# Patient Record
Sex: Male | Born: 1975
Health system: Southern US, Community
[De-identification: ages and names within clinical notes are randomized; demographics above are authoritative.]

## PROBLEM LIST (undated history)

## (undated) DIAGNOSIS — N200 Calculus of kidney: Secondary | ICD-10-CM

## (undated) DIAGNOSIS — K219 Gastro-esophageal reflux disease without esophagitis: Secondary | ICD-10-CM

## (undated) DIAGNOSIS — K921 Melena: Secondary | ICD-10-CM

## (undated) HISTORY — DX: Calculus of kidney: N20.0

## (undated) HISTORY — DX: Melena: K92.1

## (undated) HISTORY — DX: Gastro-esophageal reflux disease without esophagitis: K21.9

---

## 2004-11-10 ENCOUNTER — Encounter: Admission: RE | Admit: 2004-11-10 | Discharge: 2004-11-10 | Payer: Self-pay | Admitting: Specialist

## 2005-03-24 ENCOUNTER — Emergency Department (HOSPITAL_COMMUNITY): Admission: EM | Admit: 2005-03-24 | Discharge: 2005-03-24 | Payer: Self-pay | Admitting: Family Medicine

## 2005-09-14 ENCOUNTER — Emergency Department (HOSPITAL_COMMUNITY): Admission: EM | Admit: 2005-09-14 | Discharge: 2005-09-14 | Payer: Self-pay | Admitting: Family Medicine

## 2007-09-13 ENCOUNTER — Emergency Department (HOSPITAL_COMMUNITY): Admission: EM | Admit: 2007-09-13 | Discharge: 2007-09-13 | Payer: Self-pay | Admitting: Emergency Medicine

## 2008-09-12 ENCOUNTER — Emergency Department (HOSPITAL_COMMUNITY): Admission: EM | Admit: 2008-09-12 | Discharge: 2008-09-12 | Payer: Self-pay | Admitting: Emergency Medicine

## 2008-10-11 ENCOUNTER — Ambulatory Visit (HOSPITAL_COMMUNITY): Admission: RE | Admit: 2008-10-11 | Discharge: 2008-10-11 | Payer: Self-pay | Admitting: Gastroenterology

## 2009-10-18 ENCOUNTER — Emergency Department (HOSPITAL_COMMUNITY): Admission: EM | Admit: 2009-10-18 | Discharge: 2009-10-18 | Payer: Self-pay | Admitting: Emergency Medicine

## 2010-05-15 ENCOUNTER — Ambulatory Visit (INDEPENDENT_AMBULATORY_CARE_PROVIDER_SITE_OTHER): Payer: 59

## 2010-05-15 ENCOUNTER — Inpatient Hospital Stay (INDEPENDENT_AMBULATORY_CARE_PROVIDER_SITE_OTHER)
Admission: RE | Admit: 2010-05-15 | Discharge: 2010-05-15 | Disposition: A | Discharge status: Home / Self Care | Payer: 59 | Source: Ambulatory Visit | Attending: Family Medicine | Admitting: Family Medicine

## 2010-05-15 DIAGNOSIS — R109 Unspecified abdominal pain: Secondary | ICD-10-CM

## 2010-05-15 LAB — CBC
HCT: 44.1 % (ref 39.0–52.0)
Hemoglobin: 15.1 g/dL (ref 13.0–17.0)
WBC: 4.8 10*3/uL (ref 4.0–10.5)

## 2010-05-15 LAB — POCT I-STAT, CHEM 8
BUN: 8 mg/dL (ref 6–23)
Chloride: 100 mEq/L (ref 96–112)
Creatinine, Ser: 1.1 mg/dL (ref 0.4–1.5)
Glucose, Bld: 84 mg/dL (ref 70–99)
Potassium: 3.9 mEq/L (ref 3.5–5.1)

## 2010-12-31 ENCOUNTER — Emergency Department (HOSPITAL_COMMUNITY)
Admission: EM | Admit: 2010-12-31 | Discharge: 2011-01-01 | Disposition: A | Discharge status: Home / Self Care | Payer: 59 | Attending: Emergency Medicine | Admitting: Emergency Medicine

## 2010-12-31 DIAGNOSIS — IMO0002 Reserved for concepts with insufficient information to code with codable children: Secondary | ICD-10-CM | POA: Insufficient documentation

## 2010-12-31 DIAGNOSIS — R109 Unspecified abdominal pain: Secondary | ICD-10-CM | POA: Insufficient documentation

## 2010-12-31 DIAGNOSIS — R10819 Abdominal tenderness, unspecified site: Secondary | ICD-10-CM | POA: Insufficient documentation

## 2010-12-31 DIAGNOSIS — N201 Calculus of ureter: Secondary | ICD-10-CM | POA: Insufficient documentation

## 2011-01-01 ENCOUNTER — Emergency Department (HOSPITAL_COMMUNITY): Payer: 59

## 2011-01-01 LAB — URINALYSIS, ROUTINE W REFLEX MICROSCOPIC
Bilirubin Urine: NEGATIVE
Ketones, ur: NEGATIVE mg/dL
Protein, ur: NEGATIVE mg/dL
Urobilinogen, UA: 0.2 mg/dL (ref 0.0–1.0)

## 2011-01-01 LAB — CBC
HCT: 40.9 % (ref 39.0–52.0)
MCH: 30 pg (ref 26.0–34.0)
MCV: 85.9 fL (ref 78.0–100.0)
Platelets: 209 10*3/uL (ref 150–400)
RBC: 4.76 MIL/uL (ref 4.22–5.81)

## 2011-01-01 LAB — HEPATIC FUNCTION PANEL
ALT: 17 U/L (ref 0–53)
Bilirubin, Direct: <0.1 mg/dL (ref 0.0–0.3)
Total Bilirubin: 0.2 mg/dL — ABNORMAL LOW (ref 0.3–1.2)

## 2011-01-01 LAB — URINE MICROSCOPIC-ADD ON

## 2011-01-01 LAB — DIFFERENTIAL
Eosinophils Absolute: 0.3 10*3/uL (ref 0.0–0.7)
Eosinophils Relative: 5 % (ref 0–5)
Lymphs Abs: 3.6 10*3/uL (ref 0.7–4.0)
Monocytes Relative: 4 % (ref 3–12)
Neutrophils Relative %: 33 % — ABNORMAL LOW (ref 43–77)

## 2011-01-01 LAB — POCT I-STAT, CHEM 8
BUN: 13 mg/dL (ref 6–23)
Calcium, Ion: 1.19 mmol/L (ref 1.12–1.32)
Chloride: 103 mEq/L (ref 96–112)
Glucose, Bld: 109 mg/dL — ABNORMAL HIGH (ref 70–99)

## 2011-01-01 MED ORDER — IOHEXOL 300 MG/ML  SOLN
90.0000 mL | Freq: Once | INTRAMUSCULAR | Status: AC | PRN
  Administered 2011-01-01: 90 mL via INTRAVENOUS

## 2013-02-25 IMAGING — CT CT ABD-PELV W/ CM
2 of 4 series · 17 of 46 positions shown, 19 images · IV contrast (APPLIED)
Comparison: None.

CLINICAL DATA: Acute abdominal pain

CT ABDOMEN AND PELVIS WITH CONTRAST
TECHNIQUE: Multidetector CT imaging of the abdomen and pelvis was
performed following the standard protocol during bolus
administration of intravenous contrast.
Contrast: 90mL OMNIPAQUE IOHEXOL 300 MG/ML IV SOLN

[Series 2: abd/pelv with 5.0 b31f st · axial · 0.76mm/px · z∈[-429,+1]mm · 14 of 94 slices shown, 16 images]
[im 4/94  soft-tissue]
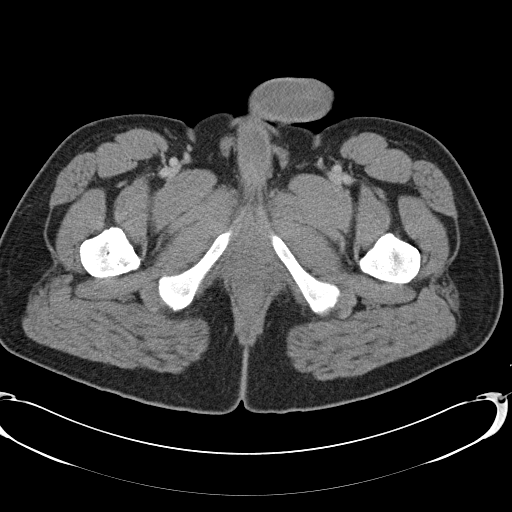
[im 4/94  bone]
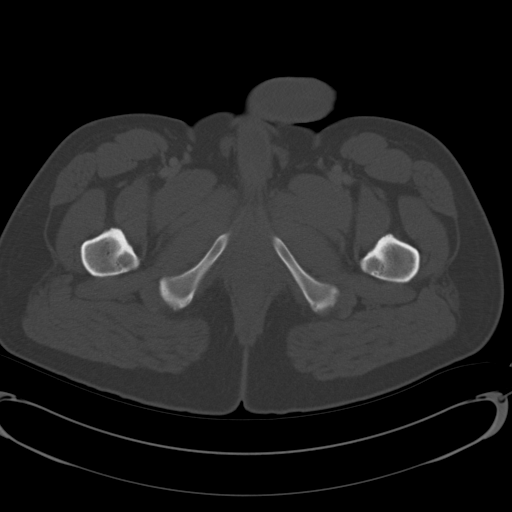
[im 12/94  soft-tissue]
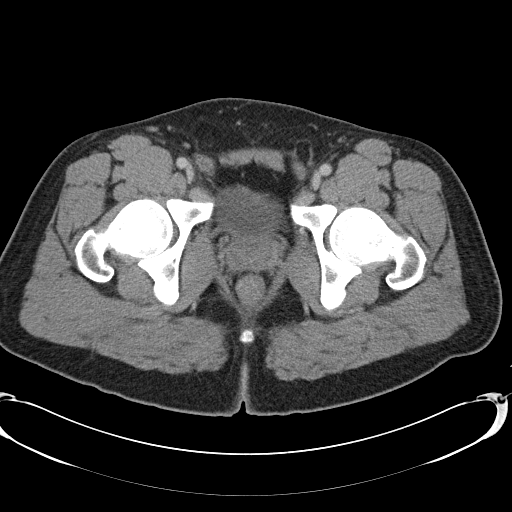
[im 20/94  soft-tissue]
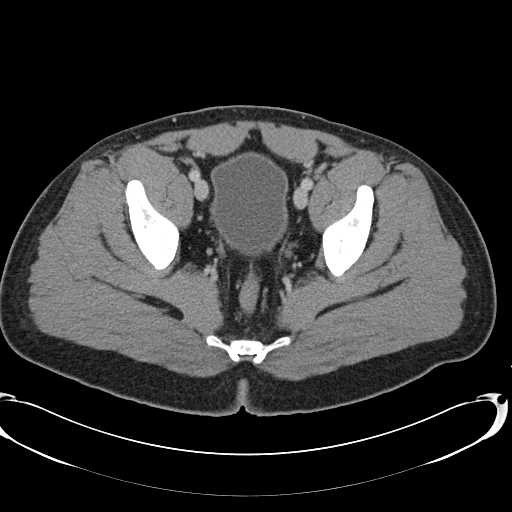
[im 24/94  soft-tissue]
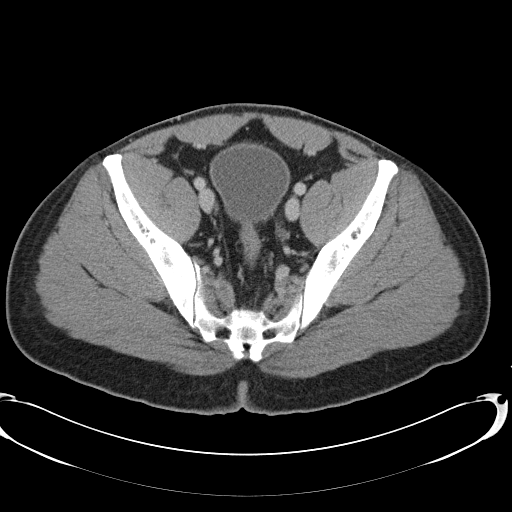
[im 32/94  soft-tissue]
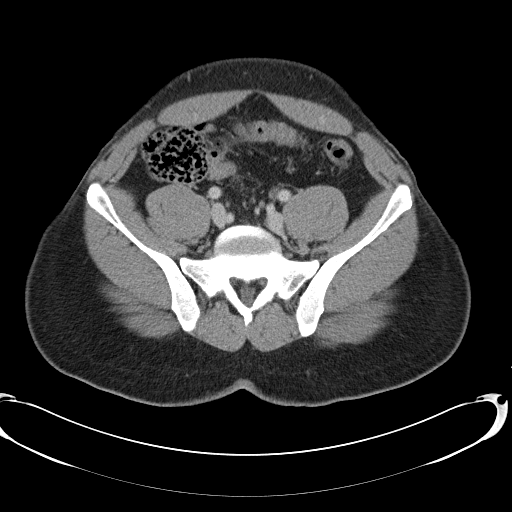
[im 39/94  soft-tissue]
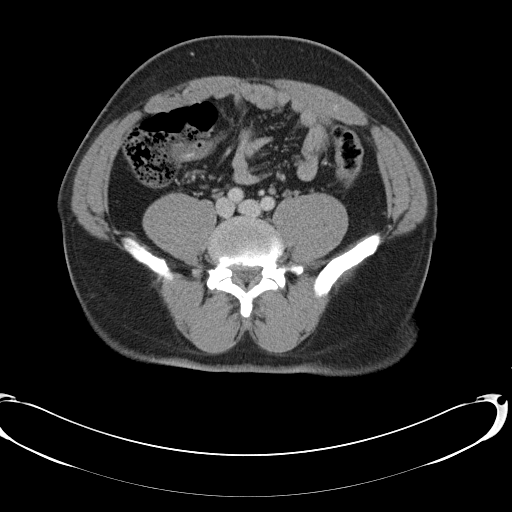
[im 43/94  soft-tissue]
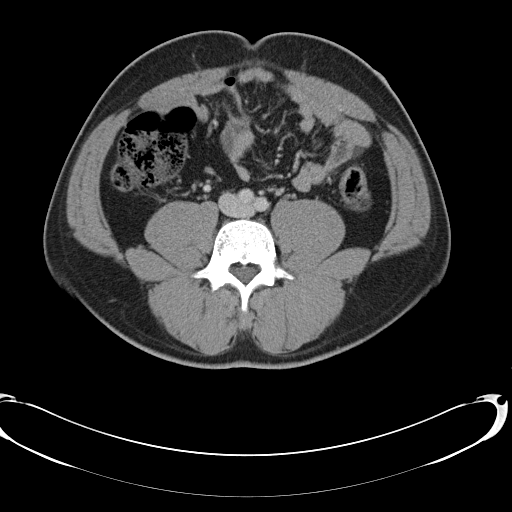
[im 51/94  soft-tissue]
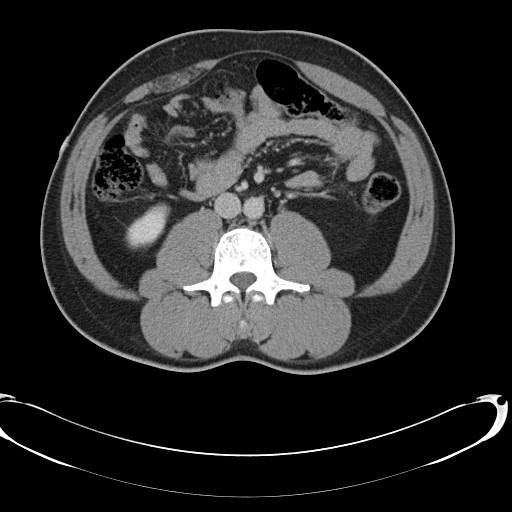
[im 55/94  soft-tissue]
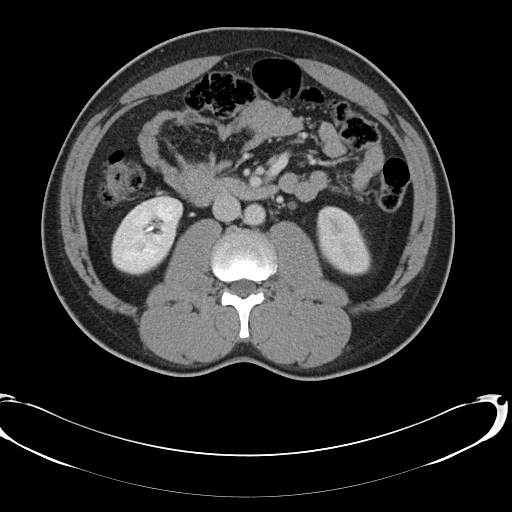
[im 55/94  bone]
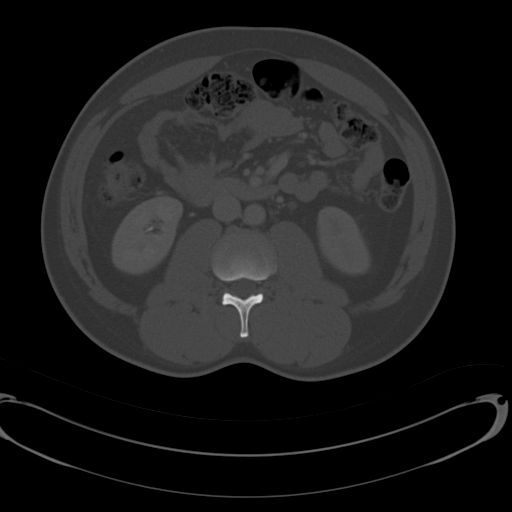
[im 63/94  soft-tissue]
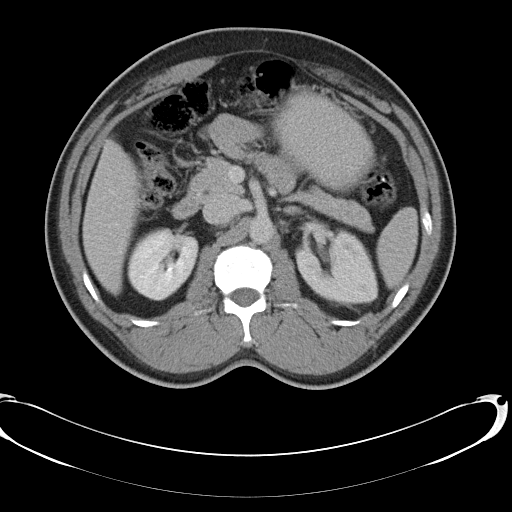
[im 70/94  soft-tissue]
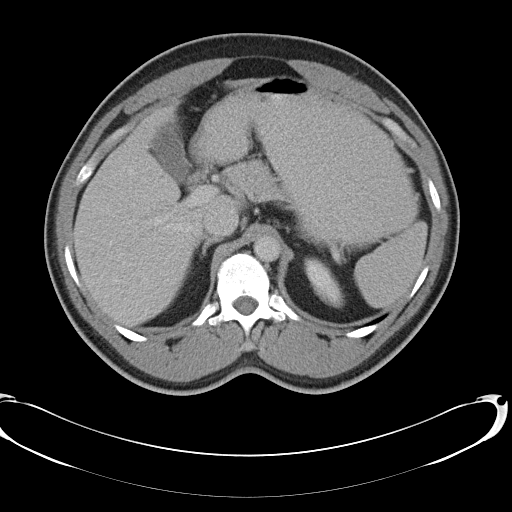
[im 74/94  soft-tissue]
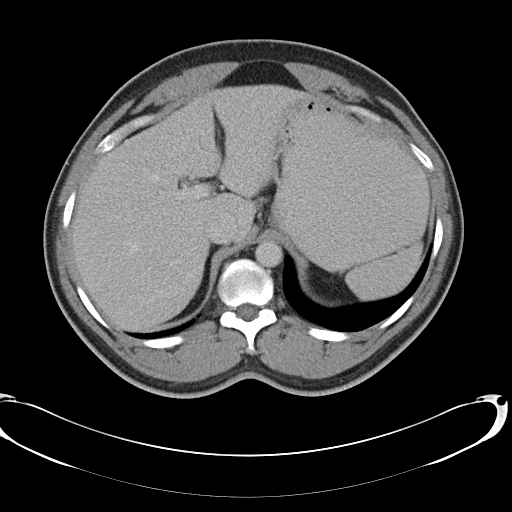
[im 82/94  soft-tissue]
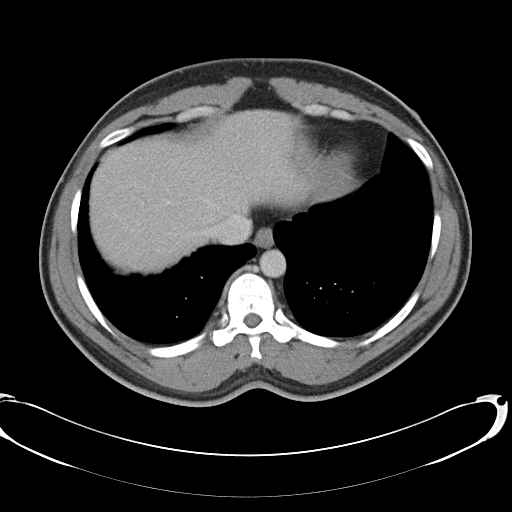
[im 90/94  soft-tissue]
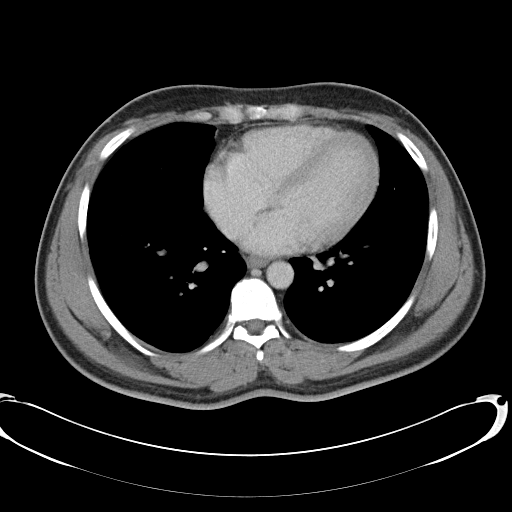

[Series 5: abd/pelv with 2.0 spo cor st · coronal · 0.91mm/px · 3 of 123 slices shown]
[im 41/123  soft-tissue]
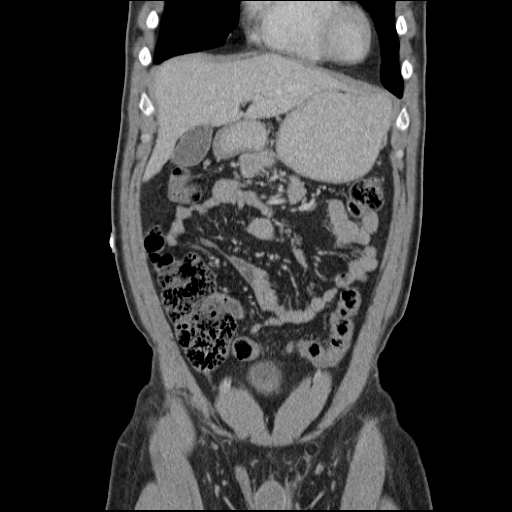
[im 55/123  soft-tissue]
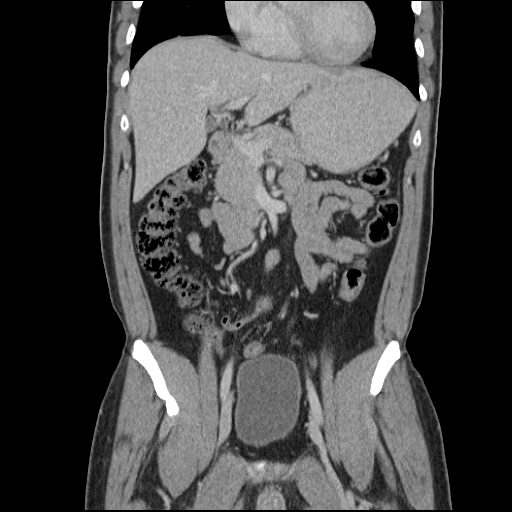
[im 68/123  soft-tissue]
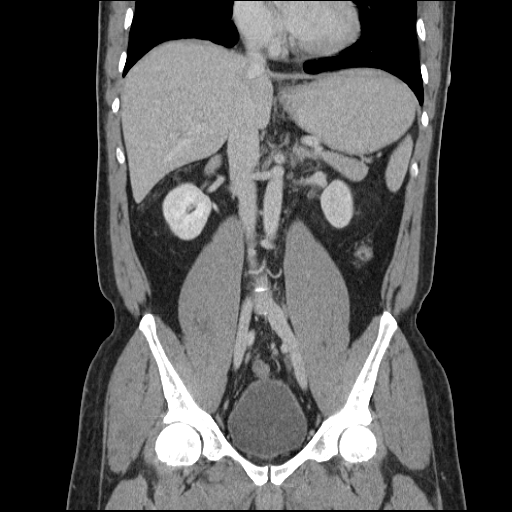

[17 of 46 positions shown; findings below may reference images not displayed]

FINDINGS: The lung bases are clear.  The liver, spleen,
gallbladder, bile ducts, pancreas, adrenal glands, stomach, small
bowel, abdominal aorta, and retroperitoneal lymph nodes are
unremarkable.  Kidneys appear symmetrical in size.  There is a
stone in the left ureter vesicle junction measuring about 4 mm
diameter with proximal left ureterectasis and pyelocaliectasis
associated with slightly delayed appearance of contrast material in
the renal collecting system.  There is mild periureteral stranding
consistent with moderate obstruction.  The right renal collecting
system and ureter are decompressed.  No free air or free fluid in
the abdomen.

Pelvis:  No free or loculated pelvic fluid collections.  The
bladder wall is not thickened.  No inflammatory changes suggested
in the colon.  The appendix is normal.  Normal alignment of the
lumbar spine.
IMPRESSION: 4 mm moderately obstructing stone in the left ureterovesicle
junction.

## 2013-09-13 ENCOUNTER — Ambulatory Visit (INDEPENDENT_AMBULATORY_CARE_PROVIDER_SITE_OTHER): Payer: 59 | Admitting: Family Medicine

## 2013-09-13 ENCOUNTER — Encounter: Payer: Self-pay | Admitting: Family Medicine

## 2013-09-13 VITALS — BP 128/82 | HR 70 | Temp 98.1°F | Ht 66.0 in | Wt 191.5 lb

## 2013-09-13 DIAGNOSIS — Z0000 Encounter for general adult medical examination without abnormal findings: Secondary | ICD-10-CM

## 2013-09-13 DIAGNOSIS — Z7689 Persons encountering health services in other specified circumstances: Secondary | ICD-10-CM

## 2013-09-13 DIAGNOSIS — J309 Allergic rhinitis, unspecified: Secondary | ICD-10-CM

## 2013-09-13 DIAGNOSIS — Z7189 Other specified counseling: Secondary | ICD-10-CM

## 2013-09-13 DIAGNOSIS — K649 Unspecified hemorrhoids: Secondary | ICD-10-CM | POA: Insufficient documentation

## 2013-09-13 DIAGNOSIS — K219 Gastro-esophageal reflux disease without esophagitis: Secondary | ICD-10-CM | POA: Insufficient documentation

## 2013-09-13 LAB — LIPID PANEL
Cholesterol: 258 mg/dL — ABNORMAL HIGH (ref 0–200)
HDL: 78.2 mg/dL (ref >39.00)
LDL Cholesterol: 158 mg/dL — ABNORMAL HIGH (ref 0–99)
NONHDL: 179.8
Total CHOL/HDL Ratio: 3
Triglycerides: 108 mg/dL (ref 0.0–149.0)
VLDL: 21.6 mg/dL (ref 0.0–40.0)

## 2013-09-13 LAB — HEMOGLOBIN A1C: HEMOGLOBIN A1C: 5.4 % (ref 4.6–6.5)

## 2013-09-13 MED ORDER — FLUTICASONE PROPIONATE 50 MCG/ACT NA SUSP: 2.0000 | Freq: Every day | NASAL | Status: AC

## 2013-09-13 NOTE — Progress Notes (Signed)
Pre visit review using our clinic review tool, if applicable. No additional management support is needed unless otherwise documented below in the visit note. 

## 2013-09-13 NOTE — Progress Notes (Signed)
No chief complaint on file.   HPI:  Kenneth Floyd is here to establish care. Needs physical form completed for physical test. Currently very active - running 3 miles and working out without any CP, SOB, wheezing, plapitations, syncope. Denies FH of sudden death. No personal hx of heart or lung disease. Will need to run 1 mile for physical test and reports does much more then this on a regular basis without any symptoms or trouble.  Last PCP and physical: has been several years  Has the following chronic problems and concerns today:  Allergies/Cough: -flare started last night -not taking anything for allergies now -symptoms: sneezing, cough, drainage, PND -denies: SOB, fever, NVD, sinus pain, tooth pain  GERD/occ BRBPR: -rare, takes zantac rarely, < a few times per month -denies: heartburn, weight loss, abd pain -evaluated by Dr. Audley HoseHong in the past with colonoscopy in 2010 and has hemorrhoids - treated with topical therapy from time to time   Patient Active Problem List   Diagnosis Date Noted  . Hemorrhoids 09/13/2013  . GERD (gastroesophageal reflux disease) 09/13/2013  . Allergic rhinitis 09/13/2013    Health Maintenance: -reviewed - doing fasting labs today for physical  Review of Systems  Constitutional: Negative for fever, chills and malaise/fatigue.  HENT: Negative for ear pain and hearing loss.   Eyes: Negative for blurred vision and double vision.  Respiratory: Positive for cough. Negative for hemoptysis, shortness of breath and wheezing.   Cardiovascular: Negative for chest pain, palpitations and leg swelling.  Gastrointestinal: Negative for heartburn, abdominal pain, diarrhea and constipation.  Genitourinary: Negative for dysuria and urgency.  Musculoskeletal: Negative for falls and myalgias.  Skin: Negative for rash.  Neurological: Negative for dizziness and weakness.  Endo/Heme/Allergies: Does not bruise/bleed easily.  Psychiatric/Behavioral: Negative for  depression.     Past Medical History  Diagnosis Date  . Blood in stool   . GERD (gastroesophageal reflux disease)   . Kidney stones     Family History  Problem Relation Age of Onset  . Heart disease Mother   . Hypertension Father   . Stroke Father     History   Social History  . Marital Status: Married    Spouse Name: N/A    Number of Children: N/A  . Years of Education: N/A   Social History Main Topics  . Smoking status: Never Smoker   . Smokeless tobacco: None  . Alcohol Use: Yes     Comment: occ few drinks  . Drug Use: No  . Sexual Activity: None   Other Topics Concern  . None   Social History Narrative   Work or School: Designer, industrial/productlab tech at [employer]      Home Situation: lives with wife and 3 kids (38 yo daughter, 38 yo daughter (coming here as well), son is 666 yo in 2015      Spiritual Beliefs: Christian      Lifestyle: regular exercise; diet is ok             Current outpatient prescriptions:fluticasone (FLONASE) 50 MCG/ACT nasal spray, Place 2 sprays into both nostrils daily., Disp: 16 g, Rfl: 1  EXAM:  Filed Vitals:   09/13/13 0955  BP: 128/82  Pulse: 70  Temp: 98.1 F (36.7 C)    Body mass index is 30.92 kg/(m^2).  GENERAL: vitals reviewed and listed above, alert, oriented, appears well hydrated and in no acute distress  HEENT: atraumatic, conjunttiva clear, no obvious abnormalities on inspection of external nose and ears  NECK: no obvious masses on inspection  LUNGS: clear to auscultation bilaterally, no wheezes, rales or rhonchi, good air movement  CV: HRRR, no peripheral edema  MS: moves all extremities without noticeable abnormality  PSYCH: pleasant and cooperative, no obvious depression or anxiety  ASSESSMENT AND PLAN:  Discussed the following assessment and plan:  Visit for preventive health examination - Plan: RPR, HIV antibody (with reflex), Hep B Surface Antigen, Hepatitis B Core AB, Total  Hemorrhoids  GERD (gastroesophageal  reflux disease)  Allergic rhinitis - Plan: fluticasone (FLONASE) 50 MCG/ACT nasal spray  Encounter to establish care - Plan: Lipid Panel, Hemoglobin A1c   -We reviewed the PMH, PSH, FH, SH, Meds and Allergies. -We provided refills for any medications we will prescribe as needed. -We addressed current concerns per orders and patient instructions. -We have asked for records for pertinent exams, studies, vaccines and notes from previous providers. -We have advised patient to follow up per instructions below. -FASTING labs per orders -level a and b uspstf recs discussed -complete form after basic labs -rx flonase and claritin for allergies -follow up as needed nad yearly  -Patient advised to return or notify a doctor immediately if symptoms worsen or persist or new concerns arise.  Patient Instructions  -start flonase and claritin daily for 1 month for allergies/cough - follow up if persists or worsens  -We have ordered labs or studies at this visit. It can take up to 1-2 weeks for results and processing. We will contact you with instructions IF your results are abnormal. Normal results will be released to your [facility]. If you have not heard from us or can not find your results in [facility] in 2 weeks please contact our office.  -PLEASE SIGN UP FOR MYCHART TODAY   We recommend the following healthy lifestyle measures: - eat a healthy diet consisting of lots of vegetables, fruits, beans, nuts, seeds, healthy meats such as white chicken and fish and whole grains.  - avoid fried foods, fast food, processed foods, sodas, red meet and other fattening foods.  - get a least 150 minutes of aerobic exercise per week.   Follow up in: as needed and in 1 year      KIM, Dahlia ClientHANNAH R.

## 2013-09-13 NOTE — Patient Instructions (Signed)
-  start flonase and claritin daily for 1 month for allergies/cough - follow up if persists or worsens  -We have ordered labs or studies at this visit. It can take up to 1-2 weeks for results and processing. We will contact you with instructions IF your results are abnormal. Normal results will be released to your [facility]. If you have not heard from us or can not find your results in [facility] in 2 weeks please contact our office.  -PLEASE SIGN UP FOR MYCHART TODAY   We recommend the following healthy lifestyle measures: - eat a healthy diet consisting of lots of vegetables, fruits, beans, nuts, seeds, healthy meats such as white chicken and fish and whole grains.  - avoid fried foods, fast food, processed foods, sodas, red meet and other fattening foods.  - get a least 150 minutes of aerobic exercise per week.   Follow up in: as needed and in 1 year

## 2013-09-14 LAB — HEPATITIS B CORE ANTIBODY, TOTAL: Hep B Core Total Ab: REACTIVE — AB

## 2013-09-14 LAB — HIV ANTIBODY (ROUTINE TESTING W REFLEX): HIV: NONREACTIVE

## 2013-09-14 LAB — HEPATITIS B SURFACE ANTIGEN: HEP B S AG: NEGATIVE

## 2013-09-14 LAB — RPR

## 2013-09-15 NOTE — Addendum Note (Signed)
Addended by: Terressa KoyanagiKIM, HANNAH R on: 09/15/2013 04:45 PM   Modules accepted: Orders

## 2013-09-18 ENCOUNTER — Telehealth: Payer: Self-pay | Admitting: Family Medicine

## 2013-09-18 NOTE — Telephone Encounter (Signed)
Dr Selena BattenKim completed the form and the original was given to the pts wife while she was here at an appt with her daughter.

## 2013-09-18 NOTE — Telephone Encounter (Signed)
Pt called to see if his paperwork for his job is ready for pick-up?  He states he dropped it off on 09/13/13.  I advised him it takes 5-7 business days and he will receive a callback once it is ready.  Pt verbalized understanding.

## 2013-10-24 ENCOUNTER — Other Ambulatory Visit (INDEPENDENT_AMBULATORY_CARE_PROVIDER_SITE_OTHER): Payer: 59

## 2013-10-24 DIAGNOSIS — Z7689 Persons encountering health services in other specified circumstances: Secondary | ICD-10-CM

## 2013-10-24 DIAGNOSIS — Z7189 Other specified counseling: Secondary | ICD-10-CM

## 2013-10-25 LAB — HEPATITIS B SURFACE ANTIBODY,QUALITATIVE: Hep B S Ab: POSITIVE — AB

## 2013-10-26 ENCOUNTER — Telehealth: Payer: Self-pay | Admitting: *Deleted

## 2013-10-26 NOTE — Telephone Encounter (Signed)
Patient called in regards to the results from the Hep B test.  I advised him he does not have Hepatitis C and the test shows he had Hepatitis B before in the past but this is resolved and he has immunity to it, no medications are needed and he agreed.

## 2013-11-22 ENCOUNTER — Telehealth: Payer: Self-pay | Admitting: Family Medicine

## 2013-11-22 ENCOUNTER — Ambulatory Visit (INDEPENDENT_AMBULATORY_CARE_PROVIDER_SITE_OTHER): Payer: 59 | Admitting: Physician Assistant

## 2013-11-22 ENCOUNTER — Encounter: Payer: Self-pay | Admitting: Physician Assistant

## 2013-11-22 VITALS — BP 104/70 | HR 72 | Temp 98.0°F | Resp 18 | Wt 192.7 lb

## 2013-11-22 DIAGNOSIS — B9789 Other viral agents as the cause of diseases classified elsewhere: Secondary | ICD-10-CM

## 2013-11-22 DIAGNOSIS — B349 Viral infection, unspecified: Secondary | ICD-10-CM

## 2013-11-22 NOTE — Telephone Encounter (Signed)
Patient Information:  Caller Name: [employer]  Phone: [phone]  Patient: Kenneth Floyd, Kenneth Floyd  Gender: Male  DOB: December 05, 1975  Age: 38 Years  PCP: Selena Batten (TEXT 1st, after 20 mins can call), Dahlia Client [facility])  Office Follow Up:  Does the office need to follow up with this patient?: No  Instructions For The Office: N/A   Symptoms  Reason For Call & Symptoms: Pt is calling to report all his muscles hurt. His legs/chest/back and arms. Pt feels like he has been beat up. Onset 2 days ago. No cold sx. Pt aler/no signs of a stiff neck. No joint swelling noted.   Reviewed Health History In EMR: Yes  Reviewed Medications In EMR: Yes  Reviewed Allergies In EMR: Yes  Reviewed Surgeries / Procedures: Yes  Date of Onset of Symptoms: 11/20/2013  Guideline(s) Used:  No Protocol Available - Sick Adult  Disposition Per Guideline:   See Today or Tomorrow in Office  Reason For Disposition Reached:   Nursing judgment  Advice Given:  N/A  Patient Will Follow Care Advice:  YES  Appointment Scheduled:  11/22/2013 15:15:00 Appointment Scheduled Provider:  Donell Beers (only sees ages 18 and up)

## 2013-11-22 NOTE — Progress Notes (Signed)
Pre visit review using our clinic review tool, if applicable. No additional management support is needed unless otherwise documented below in the visit note. 

## 2013-11-22 NOTE — Progress Notes (Signed)
Subjective:    Patient ID: Kenneth Floyd, male    DOB: [birth year], 38 y.o.   MRN: [id number]  HPI Patient is a 38 y.o. male presenting for body aches. Pt states that he started noticing all of his muscles aching 3 days ago. He feels like he "got beat up." Pt states he has felt chills, but has not recorded any fever. He also states that he feels fatigued, and yesterday he began to cough a little. The cough is dry. He also states that his saliva has begun to taste bitter, and he has been sneezing some, and has a mild headache. He also admits to a little neck soreness along with his generalized muscle aches, however no neck stiffness. No sinus pressure or pain, no post nasal drip, and has not had any ear symptoms. Patient denies abdominal pain, nausea, vomiting, diarrhea, shortness of breath, chest pain, syncope. He has taking OTC ibuprofen for the pain, but states this has not helped.      Review of Systems As per HPI and are otherwise negative.    Past Medical History  Diagnosis Date  . Blood in stool   . GERD (gastroesophageal reflux disease)   . Kidney stones     History   Social History  . Marital Status: Married    Spouse Name: N/A    Number of Children: N/A  . Years of Education: N/A   Occupational History  . Not on file.   Social History Main Topics  . Smoking status: Never Smoker   . Smokeless tobacco: Not on file  . Alcohol Use: Yes     Comment: occ few drinks  . Drug Use: No  . Sexual Activity: Not on file   Other Topics Concern  . Not on file   Social History Narrative   Work or School: Designer, industrial/product at [employer] Situation: lives with wife and 3 kids (46 yo daughter, 20 yo daughter (coming here as well), son is 33 yo in 2015      Spiritual Beliefs: Christian      Lifestyle: regular exercise; diet is ok             History reviewed. No pertinent past surgical history.  Family History  Problem Relation Age of Onset  . Heart disease Mother     . Hypertension Father   . Stroke Father     No Known Allergies  Current Outpatient Prescriptions on File Prior to Visit  Medication Sig Dispense Refill  . fluticasone (FLONASE) 50 MCG/ACT nasal spray Place 2 sprays into both nostrils daily.  16 g  1   No current facility-administered medications on file prior to visit.    EXAM: BP 104/70  Pulse 72  Temp(Src) 98 F (36.7 C) (Oral)  Resp 18  Wt 192 lb 11.2 oz (87.408 kg)      Objective:   Physical Exam  Nursing note and vitals reviewed. Constitutional: He is oriented to person, place, and time. He appears well-developed and well-nourished. No distress.  HENT:  Head: Normocephalic and atraumatic.  Right Ear: External ear normal.  Left Ear: External ear normal.  Nose: Nose normal.  Mouth/Throat: Oropharynx is clear and moist. No oropharyngeal exudate.  Bilateral TMs normal. Bilateral frontal and maxillary sinuses non-TTP.  Eyes: Conjunctivae and EOM are normal. Pupils are equal, round, and reactive to light.  Neck: Normal range of motion. Neck supple.  No nuchal rigidity.  Cardiovascular: Normal rate, regular  rhythm and intact distal pulses.   Pulmonary/Chest: Effort normal and breath sounds normal. No stridor. No respiratory distress. He has no wheezes. He has no rales. He exhibits no tenderness.  Musculoskeletal: Normal range of motion. He exhibits no tenderness.  Lymphadenopathy:    He has no cervical adenopathy.  Neurological: He is alert and oriented to person, place, and time.  Skin: Skin is warm and dry. He is not diaphoretic.  Psychiatric: He has a normal mood and affect. His behavior is normal. Judgment and thought content normal.     Lab Results  Component Value Date   WBC 6.3 12/31/2010   HGB 15.3 01/01/2011   HCT 45.0 01/01/2011   PLT 209 12/31/2010   GLUCOSE 109* 01/01/2011   CHOL 258* 09/13/2013   TRIG 108.0 09/13/2013   HDL 78.20 09/13/2013   LDLCALC 158* 09/13/2013   ALT 17 12/31/2010   AST 17  12/31/2010   NA 138 01/01/2011   K 2.9* 01/01/2011   CL 103 01/01/2011   CREATININE 1.30 01/01/2011   BUN 13 01/01/2011   HGBA1C 5.4 09/13/2013        Assessment & Plan:  Mort was seen today for generalized body aches.  Diagnoses and associated orders for this visit:  Viral syndrome Comments: Continue symptomatic treatment. Rest, push fluids, watchful waiting.    Return precautions provided, and patient handout on viral infections.  Plan to follow up as needed, or for worsening or persistent symptoms despite treatment.  Patient Instructions  Continue taking either Aleve or Advil for your pain, however you cannot take these together.  Make sure you're drinking plenty of water and getting adequate rest.  If emergency symptoms discussed during visit developed, seek medical attention immediately.  Followup as needed, or for worsening or persistent symptoms despite treatment.

## 2013-11-22 NOTE — Patient Instructions (Addendum)
Continue taking either Aleve or Advil for your pain, however you cannot take these together.  Make sure you're drinking plenty of water and getting adequate rest.  If emergency symptoms discussed during visit developed, seek medical attention immediately.  Followup as needed, or for worsening or persistent symptoms despite treatment.    Viral Infections A virus is a type of germ. Viruses can cause:  Minor sore throats.  Aches and pains.  Headaches.  Runny nose.  Rashes.  Watery eyes.  Tiredness.  Coughs.  Loss of appetite.  Feeling sick to your stomach (nausea).  Throwing up (vomiting).  Watery poop (diarrhea). HOME CARE   Only take medicines as told by your doctor.  Drink enough water and fluids to keep your pee (urine) clear or pale yellow. Sports drinks are a good choice.  Get plenty of rest and eat healthy. Soups and broths with crackers or rice are fine. GET HELP RIGHT AWAY IF:   You have a very bad headache.  You have shortness of breath.  You have chest pain or neck pain.  You have an unusual rash.  You cannot stop throwing up.  You have watery poop that does not stop.  You cannot keep fluids down.  You or your child has a temperature by mouth above 102 F (38.9 C), not controlled by medicine.  Your baby is older than 3 months with a rectal temperature of 102 F (38.9 C) or higher.  Your baby is 68 months old or younger with a rectal temperature of 100.4 F (38 C) or higher. MAKE SURE YOU:   Understand these instructions.  Will watch this condition.  Will get help right away if you are not doing well or get worse. Document Released: 02/27/2008 Document Revised: 06/08/2011 Document Reviewed: 07/22/2010 [facility] Patient Information 2015 [place], [place]. This information is not intended to replace advice given to you by your health care provider. Make sure you discuss any questions you have with your health care provider.

## 2019-08-17 ENCOUNTER — Ambulatory Visit (INDEPENDENT_AMBULATORY_CARE_PROVIDER_SITE_OTHER): Payer: Commercial Managed Care - PPO | Admitting: Surgery

## 2019-08-17 ENCOUNTER — Encounter (INDEPENDENT_AMBULATORY_CARE_PROVIDER_SITE_OTHER): Payer: Self-pay | Admitting: Surgery

## 2019-08-17 ENCOUNTER — Encounter: Payer: Self-pay | Admitting: Surgery

## 2019-08-17 VITALS — BP 141/90 | Temp 98.5°F | Ht 66.0 in | Wt 192.2 lb

## 2019-08-17 DIAGNOSIS — K429 Umbilical hernia without obstruction or gangrene: Secondary | ICD-10-CM

## 2019-08-17 DIAGNOSIS — K4090 Unilateral inguinal hernia, without obstruction or gangrene, not specified as recurrent: Secondary | ICD-10-CM

## 2019-08-17 NOTE — Progress Notes (Signed)
Visit Date Time: 08/17/2019 11:13 AM     Referring Provider:    [facility] Provider: Delena Serve, MD    Service: General Surgery    Reason For Visit:  Possible Inguinal Hernia      History of Present Illness:   Allen Bradley is a 44 y.o. male who  has no past medical history on file.Marland Kitchen  He presents with symptoms of a possible hernia.  The patient reports pain in the L groin.  Onset was several months ago. Symptoms have been gradually worsening since. Aggravating factors: activity.  Alleviating factors: none. Associated symptoms: none. The patient denies anorexia, arthralgias, constipation, diarrhea and dysuria.       Workup has included: CT with findings of small early hernia left groin.  Patient reports no history of prior hernia repair.  The patient also reports no prior low midline or pelvic surgery.    The following data was personally reviewed during the evaluation of this problem:  imaging reports and images or tracings  Past Medical History:   History reviewed. No pertinent past medical history.    Past Surgical History:   History reviewed. No pertinent surgical history.    Family History:   History reviewed. No pertinent family history.    Social History:     Social History     Socioeconomic History    Marital status: Unknown     Spouse name: Not on file    Number of children: Not on file    Years of education: Not on file    Highest education level: Not on file   Occupational History    Not on file   Tobacco Use    Smoking status: Never Smoker    Smokeless tobacco: Never Used   Substance and Sexual Activity    Alcohol use: Yes    Drug use: Never    Sexual activity: Not on file   Other Topics Concern    Not on file   Social History Narrative    Not on file     Social Determinants of Health     Financial Resource Strain:     Difficulty of Paying Living Expenses:    Food Insecurity:     Worried About Programme researcher, broadcasting/film/video in the Last Year:     Barista in the Last Year:     Transportation Needs:     Freight forwarder (Medical):     Lack of Transportation (Non-Medical):    Physical Activity:     Days of Exercise per Week:     Minutes of Exercise per Session:    Stress:     Feeling of Stress :    Social Connections:     Frequency of Communication with Friends and Family:     Frequency of Social Gatherings with Friends and Family:     Attends Religious Services:     Active Member of Clubs or Organizations:     Attends Banker Meetings:     Marital Status:    Intimate Partner Violence:     Fear of Current or Ex-Partner:     Emotionally Abused:     Physically Abused:     Sexually Abused:        Allergies:   Patient has no known allergies.    Medications:     No current outpatient medications on file.     No current facility-administered medications for this visit.  Review of Systems:   Review of Systems   Musculoskeletal:        Pain in left groin, especially with exercise.    All other systems reviewed and are negative.     As per the HPI and above. The patient otherwise denies any additional changes to their otic, opthalmologic, dermatologic, pulmonary, cardiac, gastrointestinal, genitourinary, musculoskeletal, hematologic, constitutional, or psychiatric systems.      Physical Exam:     Vitals:    08/17/19 1033   BP: 141/90   Temp:      Constitutional: Appears well, stated age.  Well nourished and well developed.  Eyes:  Conjunctiva and lids normal with no jaundice, PERRL.  Vision grossly intact.  Ear, Nose, Mouth and Throat:  Normal appearing external ears, nose and mouth.  Hearing grossly normal bilaterally.  Normal lips, teeth and gums.  Neck: Symmetric with no masses, thyromegaly, JVD, adenopathy, bruit, tenderness.  Good range of motion.  Midline trachea.  Respiratory:  Normal respiration with no effort.  No audible wheezing.  Normal and symmetric breath sounds.  No abnormalities to tenderness or percussion.  CV: Normal heart sounds with no  murmur.  Normal carotid and pedal pulses.  No peripheral edema.  Abdomen: nonobese abdomen, nontender and nondistended, with no masses or organomegaly.  Hernia exam reveals: small LIH.  LIH reducible and also of note is an 8 mm defect at the umbilicus, reducible on exam..  Lymphatic: No adenopathy of neck, axillae or groins.  Skin: Normal color and turgor with no rash, lesions or ulceration.  No induration or nodules on palpation.  Neuro: No gross deficits in cranial nerve function, sensation or motor function.  Psych: Normal judgement, insight, memory, mood, affect.  Oriented X 3.    Labs:   No results found for: CBC, BMP     Rads:     Radiology Results (24 Hour)     ** No results found for the last 24 hours. **        No results found.   Impression:     Patient Active Problem List   Diagnosis    Inguinal hernia, left    Umbilical hernia without obstruction and without gangrene     1. Umbilical hernia without obstruction and without gangrene    2. Inguinal hernia, left     Plan:     LAPAROSCOPIC INGUINAL HERNIA REPAIR: I shared my impression with him and recommend proceeding with a laparoscopic inguinal hernia repair with mesh. The risks and benefits of this procedure were outlined to include possible bilateral procedure, bleeding, infection, testicular injury in men, conversion to open procedure, injury to ilioinguinal nerve, recurrence among other unforeseen events. The alternatives to surgery were also discussed. He understood and agreed to proceed after all questions were answered. We will schedule the surgery as soon as his schedule allows.  ROBOTIC assistance planned.  UMBILICAL HERNIA PRESENT: An umbilical hernia was also noted on exam today. This can be repaired at the same time as the inguinal/femoral hernia repair. Same risks and benefits are reviewed.              Risk/benefit analysis commensurate with the ACS Risk Calculator was used to counsel the patient regarding surgery.  Thank you for sending  this patient to [facility] for evaluation.  We appreciate the opportunity to participate in their care.  We will address the issue for which they were sent, and afterward return the patient to your care and/or their PCP for any  ongoing issues.  If you have any questions, do not hesitate to contact us at [phone].  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 He will need 2 weeks off work and then no lifting > 25 lbs for 4 weeks from surgery date.    Signed by: Delena Serve, MD

## 2019-08-21 ENCOUNTER — Telehealth: Payer: Self-pay | Admitting: Surgery

## 2019-08-21 ENCOUNTER — Encounter (HOSPITAL_BASED_OUTPATIENT_CLINIC_OR_DEPARTMENT_OTHER): Payer: Self-pay | Admitting: Surgery

## 2019-08-21 NOTE — Telephone Encounter (Addendum)
Left Message for patient to call back to discuss surgery dates.  Lm for pt to call back to discuss surgery dates

## 2019-08-22 ENCOUNTER — Telehealth: Payer: Self-pay | Admitting: Surgery

## 2019-08-22 NOTE — Telephone Encounter (Addendum)
5-25-21Spoke w/pt-Pt would like to speak with billing first before scheduling a surgery date.    9.13.21-Patient will need to see Dr. Ned Grace again for consult. As of today patient has not called back to schedule surgery.

## 2019-12-11 ENCOUNTER — Telehealth: Payer: Self-pay

## 2019-12-11 NOTE — Telephone Encounter (Signed)
12-11-2019-As of today patient has not called to schedule surgery. Patient will need to come back to see Dr. Ned Grace again for another consult.

## 2020-07-31 ENCOUNTER — Emergency Department: Payer: Commercial Managed Care - PPO

## 2020-07-31 ENCOUNTER — Emergency Department
Admission: EM | Admit: 2020-07-31 | Discharge: 2020-08-01 | Disposition: A | Discharge status: Home / Self Care | Payer: Commercial Managed Care - PPO | Attending: Emergency Medicine | Admitting: Emergency Medicine

## 2020-07-31 DIAGNOSIS — R1013 Epigastric pain: Secondary | ICD-10-CM | POA: Insufficient documentation

## 2020-07-31 LAB — CBC AND DIFFERENTIAL
Absolute NRBC: 0 10*3/uL (ref 0.00–0.00)
Basophils Absolute Automated: 0.02 10*3/uL (ref 0.00–0.08)
Basophils Automated: 0.5 %
Eosinophils Absolute Automated: 0.2 10*3/uL (ref 0.00–0.44)
Eosinophils Automated: 4.8 %
Hematocrit: 42.2 % (ref 37.6–49.6)
Hgb: 13.7 g/dL (ref 12.5–17.1)
Immature Granulocytes Absolute: 0.02 10*3/uL (ref 0.00–0.07)
Immature Granulocytes: 0.5 %
Lymphocytes Absolute Automated: 1.76 10*3/uL (ref 0.42–3.22)
Lymphocytes Automated: 42.4 %
MCH: 28.4 pg (ref 25.1–33.5)
MCHC: 32.5 g/dL (ref 31.5–35.8)
MCV: 87.6 fL (ref 78.0–96.0)
MPV: 8.7 fL — ABNORMAL LOW (ref 8.9–12.5)
Monocytes Absolute Automated: 0.42 10*3/uL (ref 0.21–0.85)
Monocytes: 10.1 %
Neutrophils Absolute: 1.73 10*3/uL (ref 1.10–6.33)
Neutrophils: 41.7 %
Nucleated RBC: 0 /100 WBC (ref 0.0–0.0)
Platelets: 221 10*3/uL (ref 142–346)
RBC: 4.82 10*6/uL (ref 4.20–5.90)
RDW: 13 % (ref 11–15)
WBC: 4.15 10*3/uL (ref 3.10–9.50)

## 2020-07-31 LAB — COMPREHENSIVE METABOLIC PANEL
ALT: 15 U/L (ref 0–55)
AST (SGOT): 15 U/L (ref 5–34)
Albumin/Globulin Ratio: 1.2 (ref 0.9–2.2)
Albumin: 4.1 g/dL (ref 3.5–5.0)
Alkaline Phosphatase: 33 U/L — ABNORMAL LOW (ref 37–117)
Anion Gap: 9 (ref 5.0–15.0)
BUN: 14 mg/dL (ref 9.0–28.0)
Bilirubin, Total: 0.6 mg/dL (ref 0.2–1.2)
CO2: 27 mEq/L (ref 22–29)
Calcium: 9.6 mg/dL (ref 8.5–10.5)
Chloride: 102 mEq/L (ref 100–111)
Creatinine: 1.2 mg/dL (ref 0.7–1.3)
Globulin: 3.4 g/dL (ref 2.0–3.6)
Glucose: 91 mg/dL (ref 70–100)
Potassium: 4.4 mEq/L (ref 3.5–5.1)
Protein, Total: 7.5 g/dL (ref 6.0–8.3)
Sodium: 138 mEq/L (ref 136–145)

## 2020-07-31 LAB — LIPASE: Lipase: 27 U/L (ref 8–78)

## 2020-07-31 LAB — GFR: EGFR: >60

## 2020-07-31 MED ORDER — FAMOTIDINE 10 MG/ML IV SOLN (WRAP)
20.0000 mg | Freq: Once | INTRAVENOUS | Status: AC
Start: 2020-07-31 — End: 2020-07-31

## 2020-07-31 MED ORDER — SODIUM CHLORIDE 0.9 % IV BOLUS
1000.0000 mL | Freq: Once | INTRAVENOUS | Status: AC
Start: 2020-07-31 — End: 2020-08-01
  Administered 2020-07-31: 1000 mL via INTRAVENOUS

## 2020-07-31 MED ORDER — LIDOCAINE VISCOUS HCL 2 % MT SOLN
10.0000 mL | Freq: Once | OROMUCOSAL | Status: AC
Start: 2020-07-31 — End: 2020-08-01
  Administered 2020-08-01: 10 mL via OROMUCOSAL
  Filled 2020-07-31: qty 15

## 2020-07-31 MED ORDER — FAMOTIDINE 10 MG/ML IV SOLN (WRAP)
INTRAVENOUS | Status: AC
Start: 2020-07-31 — End: 2020-07-31
  Administered 2020-07-31: 20 mg via INTRAVENOUS
  Filled 2020-07-31: qty 2

## 2020-07-31 MED ORDER — ALUM & MAG HYDROXIDE-SIMETH 200-200-20 MG/5ML PO SUSP
30.0000 mL | Freq: Once | ORAL | Status: AC
Start: 2020-07-31 — End: 2020-08-01
  Administered 2020-08-01: 30 mL via ORAL
  Filled 2020-07-31: qty 30

## 2020-07-31 NOTE — ED Triage Notes (Signed)
[facility] EMERGENCY DEPARTMENT MEDICAL SCREENING EXAM NOTE        Patient Name: Allen Bradley    Chief Complaint:   Chief Complaint   Patient presents with   . Abdominal Pain       HPI: Allen Bradley is a 45 y.o. male, who has had a rapid medical screening evaluation initiated by myself for the chief complaint of epigastric and LUQ abdominal pain intermittent x 1 month and now persistent x 2 days and radiating both upwards and downwards towards LLQ. Patient reports pain is burning and worsens with eating. Denies fever, n/v/d, stool changes, flank pain, hematuria or urinary symptoms.       ROS: Please see HPI.     Medical/Surgical/Social history: as per HPI    Vitals: BP 131/88   Pulse 76   Temp 97.8 F (36.6 C)   Resp 18   SpO2 99%     Pertinent brief exam:   Constitutional: No acute distress.Well-nourished.  Cardiovascular: Normal heart rate.   Pulmonary: Normal effort.  No respiratory distress. No stridor.  Skin: Normal skin color. No diaphoresis.  Psych: Normal affect.     Additional pertinent physical exam findings: epigastric and LUQ tenderness    Preliminary orders: EKG, Labs, UA. Pepcid    Symptom based diagnosis: abdominal pain      Patient advised to remain in the ED until further evaluation can be performed. Patient instructed to notify staff of any changes in condition while waiting.    This brief assessment is an initial evaluation to expedite care prior to full evaluation. I am not the primary clinician or sole provider for this patient.

## 2020-07-31 NOTE — ED Triage Notes (Signed)
Allen Bradley is a 45 y.o. male presenting to the ED for left sided abdominal pain that began a month ago characterized as a burning with eating. Patient reports 2 days ago it began to be a constant pain.

## 2020-07-31 NOTE — ED Provider Notes (Signed)
EMERGENCY DEPARTMENT HISTORY AND PHYSICAL EXAM      Date: 07/31/2020  Patient Name: Allen Bradley    History of Presenting Illness     Chief Complaint   Patient presents with   . Abdominal Pain       History Provided By: Patient    Additional History: Allen Bradley is a 45 y.o. male presenting to the ED with abdominal pain.  Over the last 2 months he has had intermittent episodes of left upper quadrant abdominal pain.  Over the last week it has become more constant and consistent.  He describes it as a burning sensation that is worse with eating and drinking.  He has nausea, but no vomiting.  He denies any fevers or chills.  He denies any history of surgeries.  He says over the last week it is also now radiated to the left lower quadrant.      PCP: Pcp, None, MD  SPECIALISTS:    No current facility-administered medications for this encounter.     Current Outpatient Medications   Medication Sig Dispense Refill   . famotidine (PEPCID) 20 MG tablet Take 1 tablet (20 mg total) by mouth 2 (two) times daily for 7 days 14 tablet 0       Past History     Past Medical History:  History reviewed. No pertinent past medical history.    Past Surgical History:  Past Surgical History:   Procedure Laterality Date   . LASIK         Family History:  History reviewed. No pertinent family history.    Social History:  Social History     Tobacco Use   . Smoking status: Never Smoker   . Smokeless tobacco: Never Used   Vaping Use   . Vaping Use: Never used   Substance Use Topics   . Alcohol use: Yes   . Drug use: Never       Allergies:  No Known Allergies    Review of Systems     Review of Systems   Constitutional: Negative for chills, fatigue and fever.   HENT: Negative for congestion, rhinorrhea and sore throat.    Respiratory: Negative for cough, chest tightness and shortness of breath.    Cardiovascular: Negative for chest pain, palpitations and leg swelling.   Gastrointestinal: Positive for abdominal pain and nausea. Negative for  abdominal distention, diarrhea and vomiting.   Musculoskeletal: Negative for back pain, joint swelling and myalgias.   Skin: Negative for color change, pallor, rash and wound.   Neurological: Negative for dizziness, weakness, numbness and headaches.       Physical Exam   BP 122/83   Pulse 73   Temp 97.8 F (36.6 C)   Resp 18   SpO2 98%     Physical Exam  Vitals reviewed.   Constitutional:       General: He is not in acute distress.     Appearance: Normal appearance. He is not ill-appearing or toxic-appearing.   HENT:      Head: Normocephalic and atraumatic.      Nose: Nose normal. No congestion or rhinorrhea.      Mouth/Throat:      Pharynx: No posterior oropharyngeal erythema.   Eyes:      General: No scleral icterus.        Right eye: No discharge.         Left eye: No discharge.      Extraocular Movements: Extraocular movements intact.  Conjunctiva/sclera: Conjunctivae normal.   Cardiovascular:      Rate and Rhythm: Normal rate and regular rhythm.   Pulmonary:      Effort: Pulmonary effort is normal. No respiratory distress.      Breath sounds: Normal breath sounds.   Abdominal:      General: Abdomen is flat. There is no distension.      Palpations: Abdomen is soft.      Tenderness: There is abdominal tenderness in the left lower quadrant.   Musculoskeletal:         General: No swelling or deformity. Normal range of motion.      Cervical back: Normal range of motion and neck supple. No muscular tenderness.   Skin:     General: Skin is warm and dry.      Capillary Refill: Capillary refill takes less than 2 seconds.   Neurological:      General: No focal deficit present.      Mental Status: He is alert and oriented to person, place, and time. Mental status is at baseline.   Psychiatric:         Mood and Affect: Mood normal.         Behavior: Behavior normal.         Diagnostic Study Results     Labs -     Results     Procedure Component Value Units Date/Time    Urinalysis Reflex to Microscopic Exam- Reflex  to Culture [[id number]] Collected: 08/01/20 0031    Specimen: Urine, Clean Catch Updated: 08/01/20 0040     Urine Type Urine, Clean Ca     Color, UA Yellow     Clarity, UA Clear     Specific Gravity UA 1.014     Urine pH 6.0     Leukocyte Esterase, UA Negative     Nitrite, UA Negative     Protein, UR Negative     Glucose, UA Negative     Ketones UA Negative     Urobilinogen, UA Negative mg/dL      Bilirubin, UA Negative     Blood, UA Negative    Comprehensive metabolic panel [[id number]]  (Abnormal) Collected: 07/31/20 2110    Specimen: Blood Updated: 07/31/20 2137     Glucose 91 mg/dL      BUN 36.6 mg/dL      Creatinine 1.2 mg/dL      Sodium 440 mEq/L      Potassium 4.4 mEq/L      Chloride 102 mEq/L      CO2 27 mEq/L      Calcium 9.6 mg/dL      Protein, Total 7.5 g/dL      Albumin 4.1 g/dL      AST (SGOT) 15 U/L      ALT 15 U/L      Alkaline Phosphatase 33 U/L      Bilirubin, Total 0.6 mg/dL      Globulin 3.4 g/dL      Albumin/Globulin Ratio 1.2     Anion Gap 9.0    Lipase [[id number]] Collected: 07/31/20 2110    Specimen: Blood Updated: 07/31/20 2137     Lipase 27 U/L     GFR [[id number]] Collected: 07/31/20 2110     Updated: 07/31/20 2137     EGFR >60.0    CBC and differential [[id number]]  (Abnormal) Collected: 07/31/20 2110    Specimen: Blood Updated: 07/31/20 2118     WBC 4.15 x10 3/uL  Hgb 13.7 g/dL      Hematocrit 30.1 %      Platelets 221 x10 3/uL      RBC 4.82 x10 6/uL      MCV 87.6 fL      MCH 28.4 pg      MCHC 32.5 g/dL      RDW 13 %      MPV 8.7 fL      Neutrophils 41.7 %      Lymphocytes Automated 42.4 %      Monocytes 10.1 %      Eosinophils Automated 4.8 %      Basophils Automated 0.5 %      Immature Granulocytes 0.5 %      Nucleated RBC 0.0 /100 WBC      Neutrophils Absolute 1.73 x10 3/uL      Lymphocytes Absolute Automated 1.76 x10 3/uL      Monocytes Absolute Automated 0.42 x10 3/uL      Eosinophils Absolute Automated 0.20 x10 3/uL      Basophils Absolute Automated 0.02 x10 3/uL      Immature  Granulocytes Absolute 0.02 x10 3/uL      Absolute NRBC 0.00 x10 3/uL           Radiologic Studies -   Radiology Results (24 Hour)     Procedure Component Value Units Date/Time    CT Abd/ Pelvis without Contrast [[id number]] Collected: 07/31/20 2352    Order Status: Completed Updated: 07/31/20 2357    Narrative:      CT ABDOMEN PELVIS WO IV/ WO PO CONT      HISTORY: LLQ abdominal pain, diverticulitis suspected.    COMPARISON: None.    TECHNIQUE: Multiple axial CT images were obtained through the abdomen  and pelvis without contrast. Coronal and sagittal reconstructions were  performed. One of the following dose optimization techniques was  utilized in the performance of this exam: Automated exposure control;  adjustment of the mA and/or kV according to the patient's size; or use  of an iterative  reconstruction technique.  Specific details can be  referenced in the facility's radiology CT exam operational policy.    FINDINGS:    Lung bases: Clear.    Hepatobiliary: The liver is normal. The gallbladder is normal. The  common bile duct is normal in size.     Spleen: Normal.    Pancreas: Normal.    Adrenals/Kidneys: The adrenal glands are normal. Kidneys are normal. No  stones or hydronephrosis.    Bowel/Mesentery/peritoneum: Appendix is normal. Bowel is normal in  caliber.No evidence of diverticulitis. No free air. No free fluid.    Lymph nodes: No enlarged lymph nodes.    Vessels: Minimal vascular calcifications are present.    Pelvic GU: The bladder is normal.    Other: Negative.    Musculoskeletal: There are no suspicious bony lesions.        Impression:        No acute disease in the abdomen or pelvis.    Jorene Guest, MD   07/31/2020 11:55 PM      .    Medical Decision Making   I am the first provider for this patient.    I reviewed the vital signs, available nursing notes, past medical history, past surgical history, family history and social history.    Vital Signs-Reviewed the patient's vital signs.     No data  found.    Pulse Oximetry Analysis - Normal 99% on RA  Old Medical Records: Old medical records.  Nursing notes.     ED Course:   ED Course as of 08/02/20 0311   Thu Aug 01, 2020   0135 Patient's labs and CT abdomen pelvis are unremarkable.  We will discharge home with prescription for Pepcid.  I provided him outpatient GI follow-up information.  We discussed return precautions.  We will discharge home. [VG]      ED Course User Index  [VG] Everlean Cherry, MD       Provider Notes: 45 year old male presents to the emergency department for abdominal pain.  Differential diagnosis includes reflux, gastritis, duodenal ulcer, gastric ulcer, diverticulitis..  Lower suspicion for cholelithiasis, cholecystitis, pancreatitis, small bowel obstruction, UTI.  Plan provide GI cocktail.  Plan to check CBC, CMP, lipase, urinalysis, CT abdomen pelvis.        Diagnosis     Clinical Impression:   1. Epigastric pain        Treatment Plan:   ED Disposition     ED Disposition   Discharge    Condition   --    Date/Time   Thu Aug 01, 2020  1:35 AM    Comment   Katherina Right discharge to home/self care.    Condition at disposition: Stable                 _______________________________      Attestations: This note is prepared by Roseanne Kaufman, MD.    _______________________________     Everlean Cherry, MD  08/02/20 [phone]

## 2020-08-01 LAB — URINALYSIS REFLEX TO MICROSCOPIC EXAM - REFLEX TO CULTURE
Bilirubin, UA: NEGATIVE
Blood, UA: NEGATIVE
Glucose, UA: NEGATIVE
Ketones UA: NEGATIVE
Leukocyte Esterase, UA: NEGATIVE
Nitrite, UA: NEGATIVE
Protein, UR: NEGATIVE
Specific Gravity UA: 1.014 (ref 1.001–1.035)
Urine pH: 6 (ref 5.0–8.0)
Urobilinogen, UA: NEGATIVE mg/dL (ref 0.2–2.0)

## 2020-08-01 MED ORDER — FAMOTIDINE 20 MG PO TABS
20.0000 mg | ORAL_TABLET | Freq: Two times a day (BID) | ORAL | 0 refills | Status: AC
Start: 2020-08-01 — End: 2020-08-08

## 2020-08-05 LAB — ECG 12-LEAD
Atrial Rate: 72 {beats}/min
IHS MUSE NARRATIVE AND IMPRESSION: NORMAL
P Axis: 65 degrees
P-R Interval: 154 ms
Q-T Interval: 384 ms
QRS Duration: 86 ms
QTC Calculation (Bezet): 420 ms
R Axis: -16 degrees
T Axis: 10 degrees
Ventricular Rate: 72 {beats}/min

## 2020-08-07 ENCOUNTER — Encounter (INDEPENDENT_AMBULATORY_CARE_PROVIDER_SITE_OTHER): Payer: Self-pay

## 2020-08-09 ENCOUNTER — Encounter (INDEPENDENT_AMBULATORY_CARE_PROVIDER_SITE_OTHER): Payer: Self-pay
